# Patient Record
Sex: Female | Born: 1980
Health system: Southern US, Community
[De-identification: ages and names within clinical notes are randomized; demographics above are authoritative.]

## PROBLEM LIST (undated history)

## (undated) DIAGNOSIS — L709 Acne, unspecified: Secondary | ICD-10-CM

## (undated) DIAGNOSIS — F32A Depression, unspecified: Secondary | ICD-10-CM

## (undated) DIAGNOSIS — F329 Major depressive disorder, single episode, unspecified: Secondary | ICD-10-CM

## (undated) HISTORY — PX: BREAST ENHANCEMENT SURGERY: SHX7

---

## 2004-07-03 DIAGNOSIS — D229 Melanocytic nevi, unspecified: Secondary | ICD-10-CM

## 2004-07-03 HISTORY — DX: Melanocytic nevi, unspecified: D22.9

## 2008-03-08 ENCOUNTER — Emergency Department (HOSPITAL_COMMUNITY): Admission: EM | Admit: 2008-03-08 | Discharge: 2008-03-08 | Payer: Self-pay | Admitting: Emergency Medicine

## 2017-04-01 ENCOUNTER — Other Ambulatory Visit: Payer: Self-pay | Admitting: Physician Assistant

## 2017-04-01 ENCOUNTER — Other Ambulatory Visit (HOSPITAL_COMMUNITY)
Admission: RE | Admit: 2017-04-01 | Discharge: 2017-04-01 | Disposition: A | Discharge status: Home / Self Care | Payer: Managed Care, Other (non HMO) | Source: Ambulatory Visit | Attending: Physician Assistant | Admitting: Physician Assistant

## 2017-04-01 DIAGNOSIS — Z124 Encounter for screening for malignant neoplasm of cervix: Secondary | ICD-10-CM | POA: Insufficient documentation

## 2017-04-08 LAB — CYTOLOGY - PAP
Bacterial vaginitis: NEGATIVE
Candida vaginitis: NEGATIVE
DIAGNOSIS: NEGATIVE
HPV (WINDOPATH): NOT DETECTED
TRICH (WINDOWPATH): NEGATIVE

## 2017-11-18 MED FILL — valACYclovir HCL 1 GM TABS: 1 | 30 days supply | Qty: 60 | Fill #0

## 2017-12-03 DIAGNOSIS — F4323 Adjustment disorder with mixed anxiety and depressed mood: Secondary | ICD-10-CM | POA: Diagnosis not present

## 2017-12-16 DIAGNOSIS — F4323 Adjustment disorder with mixed anxiety and depressed mood: Secondary | ICD-10-CM | POA: Diagnosis not present

## 2017-12-17 DIAGNOSIS — F329 Major depressive disorder, single episode, unspecified: Secondary | ICD-10-CM | POA: Diagnosis not present

## 2017-12-23 DIAGNOSIS — F4323 Adjustment disorder with mixed anxiety and depressed mood: Secondary | ICD-10-CM | POA: Diagnosis not present

## 2017-12-29 DIAGNOSIS — F329 Major depressive disorder, single episode, unspecified: Secondary | ICD-10-CM | POA: Diagnosis not present

## 2018-01-11 DIAGNOSIS — L709 Acne, unspecified: Secondary | ICD-10-CM | POA: Diagnosis not present

## 2018-01-14 DIAGNOSIS — F4323 Adjustment disorder with mixed anxiety and depressed mood: Secondary | ICD-10-CM | POA: Diagnosis not present

## 2018-01-21 DIAGNOSIS — F4323 Adjustment disorder with mixed anxiety and depressed mood: Secondary | ICD-10-CM | POA: Diagnosis not present

## 2018-01-26 DIAGNOSIS — F329 Major depressive disorder, single episode, unspecified: Secondary | ICD-10-CM | POA: Diagnosis not present

## 2018-02-15 DIAGNOSIS — F329 Major depressive disorder, single episode, unspecified: Secondary | ICD-10-CM | POA: Diagnosis not present

## 2018-03-11 DIAGNOSIS — F4323 Adjustment disorder with mixed anxiety and depressed mood: Secondary | ICD-10-CM | POA: Diagnosis not present

## 2018-04-26 DIAGNOSIS — F4323 Adjustment disorder with mixed anxiety and depressed mood: Secondary | ICD-10-CM | POA: Diagnosis not present

## 2018-05-09 DIAGNOSIS — Z202 Contact with and (suspected) exposure to infections with a predominantly sexual mode of transmission: Secondary | ICD-10-CM | POA: Diagnosis not present

## 2018-05-09 DIAGNOSIS — R238 Other skin changes: Secondary | ICD-10-CM | POA: Diagnosis not present

## 2018-05-09 DIAGNOSIS — Z0001 Encounter for general adult medical examination with abnormal findings: Secondary | ICD-10-CM | POA: Diagnosis not present

## 2018-05-09 DIAGNOSIS — F329 Major depressive disorder, single episode, unspecified: Secondary | ICD-10-CM | POA: Diagnosis not present

## 2018-05-10 MED FILL — IMIQUIMOD 5 % CREA: 5 | 30 days supply | Qty: 24 | Fill #0 | Status: TO

## 2018-06-03 DIAGNOSIS — F4323 Adjustment disorder with mixed anxiety and depressed mood: Secondary | ICD-10-CM | POA: Diagnosis not present

## 2018-06-29 ENCOUNTER — Other Ambulatory Visit: Payer: Self-pay | Admitting: Physician Assistant

## 2018-06-29 DIAGNOSIS — D485 Neoplasm of uncertain behavior of skin: Secondary | ICD-10-CM | POA: Diagnosis not present

## 2018-07-29 ENCOUNTER — Emergency Department (INDEPENDENT_AMBULATORY_CARE_PROVIDER_SITE_OTHER): Payer: 59

## 2018-07-29 ENCOUNTER — Encounter: Payer: Self-pay | Admitting: *Deleted

## 2018-07-29 ENCOUNTER — Emergency Department (INDEPENDENT_AMBULATORY_CARE_PROVIDER_SITE_OTHER)
Admission: EM | Admit: 2018-07-29 | Discharge: 2018-07-29 | Disposition: A | Discharge status: Home / Self Care | Payer: 59 | Source: Home / Self Care | Attending: Family Medicine | Admitting: Family Medicine

## 2018-07-29 ENCOUNTER — Other Ambulatory Visit: Payer: Self-pay

## 2018-07-29 DIAGNOSIS — S52122A Displaced fracture of head of left radius, initial encounter for closed fracture: Secondary | ICD-10-CM

## 2018-07-29 DIAGNOSIS — S52125A Nondisplaced fracture of head of left radius, initial encounter for closed fracture: Secondary | ICD-10-CM

## 2018-07-29 DIAGNOSIS — M25422 Effusion, left elbow: Secondary | ICD-10-CM | POA: Diagnosis not present

## 2018-07-29 HISTORY — DX: Major depressive disorder, single episode, unspecified: F32.9

## 2018-07-29 HISTORY — DX: Depression, unspecified: F32.A

## 2018-07-29 HISTORY — DX: Acne, unspecified: L70.9

## 2018-07-29 NOTE — Discharge Instructions (Addendum)
Wear splint and sling. Apply ice pack for 20 to 30 minutes, 3 to 4 times daily  Continue until pain and swelling decrease.  May take Tylenol as needed for pain.

## 2018-07-29 NOTE — ED Triage Notes (Signed)
Pt c/o LT elbow pain post fall off of a hover board at 1700. She took IBF 800mg .

## 2018-07-29 NOTE — ED Provider Notes (Signed)
[facility]    CSN: [id number] Arrival date & time: 07/29/18  1918     History   Chief Complaint Chief Complaint  Patient presents with   Elbow Pain    HPI Tiffany Gonzalez is a 37 y.o. female.   Patient lost her balance while on her son's new hoverboard today.  She fell backwards, bracing herself with her left arm.  She subsequently had pain/swelling in her left elbow with mild pain in her proximal left forearm.  She denies other injury.   Arm Injury  Location:  Elbow and arm Arm location:  L forearm Elbow location:  L elbow Injury: yes   Time since incident:  3 hours Mechanism of injury comment:  Fall from a hoverboard Pain details:    Quality:  Aching   Radiates to:  L forearm   Severity:  Moderate   Onset quality:  Sudden   Duration:  3 hours   Timing:  Constant   Progression:  Unchanged Dislocation: no   Prior injury to area:  No Relieved by:  Nothing Worsened by:  Movement Ineffective treatments:  Ice Associated symptoms: decreased range of motion and swelling   Associated symptoms: no muscle weakness, no numbness, no stiffness and no tingling     Past Medical History:  Diagnosis Date   Acne    Depression     There are no active problems to display for this patient.   Past Surgical History:  Procedure Laterality Date   BREAST ENHANCEMENT SURGERY     CESAREAN SECTION      OB History   No obstetric history on file.      Home Medications    Prior to Admission medications   Medication Sig Start Date End Date Taking? Authorizing Provider  sertraline (ZOLOFT) 100 MG tablet Take 100 mg by mouth daily.   Yes [provider]  spironolactone (ALDACTONE) 100 MG tablet Take 100 mg by mouth daily.   Yes [provider]    Family History Family History  Problem Relation Age of Onset   Cancer Mother        breast   Heart failure Father     Social History Social History   Tobacco Use   Smoking status:  Never Smoker   Smokeless tobacco: Never Used  Substance Use Topics   Alcohol use: Yes   Drug use: Never     Allergies   Patient has no known allergies.   Review of Systems Review of Systems  Musculoskeletal: Negative for stiffness.  All other systems reviewed and are negative.    Physical Exam Triage Vital Signs ED Triage Vitals [07/29/18 1937]  Enc Vitals Group     BP 121/77     Pulse Rate 78     Resp 18     Temp 97.6 F (36.4 C)     Temp Source Oral     SpO2 97 %     Weight 141 lb (64 kg)     Height 5\' 5"  (1.651 m)     Head Circumference      Peak Flow      Pain Score 5     Pain Loc      Pain Edu?      Excl. in [place]?    No data found.  Updated Vital Signs BP 121/77 (BP Location: Right Arm)    Pulse 78    Temp 97.6 F (36.4 C) (Oral)    Resp 18  Ht 5\' 5"  (1.651 m)    Wt 64 kg    LMP 07/22/2018    SpO2 97%    BMI 23.46 kg/m   Visual Acuity Right Eye Distance:   Left Eye Distance:   Bilateral Distance:    Right Eye Near:   Left Eye Near:    Bilateral Near:     Physical Exam Vitals signs and nursing note reviewed.  Constitutional:      General: She is not in acute distress.    Appearance: Normal appearance. She is not ill-appearing or diaphoretic.  HENT:     Head: Normocephalic.     Right Ear: External ear normal.     Left Ear: External ear normal.     Nose: Nose normal.  Eyes:     Pupils: Pupils are equal, round, and reactive to light.  Neck:     Musculoskeletal: Normal range of motion.  Cardiovascular:     Rate and Rhythm: Normal rate.  Pulmonary:     Effort: Pulmonary effort is normal.  Musculoskeletal:     Left elbow: She exhibits decreased range of motion and swelling. Tenderness found. Radial head and olecranon process tenderness noted.       Arms:     Comments: Left elbow has diffuse swelling/tenderness over olecranon.  She has difficulty fully extending left elbow.  Tenderness extends to left proximal forearm.  There is tenderness  over the radial head.  Left shoulder and wrist have full range of motion.  Distal neurovascular function is intact.   Skin:    General: Skin is warm and dry.  Neurological:     Mental Status: She is alert.      UC Treatments / Results  Labs (all labs ordered are listed, but only abnormal results are displayed) Labs Reviewed - No data to display  EKG None  Radiology Dg Elbow Complete Left  Result Date: 07/29/2018 CLINICAL DATA:  Left elbow pain after falling off of a hoverboard today. Initial encounter. EXAM: LEFT ELBOW - COMPLETE 3+ VIEW COMPARISON:  None. FINDINGS: There is a large elbow joint effusion, and there is a radial head fracture demonstrating at most minimal displacement. There is no dislocation. IMPRESSION: Radial head fracture with large elbow joint effusion. Electronically Signed   By: Logan Bores M.D.   On: 07/29/2018 20:09   Dg Forearm Left  Result Date: 07/29/2018 CLINICAL DATA:  Left elbow pain after falling off of a hoverboard today. Initial encounter. EXAM: LEFT FOREARM - 2 VIEW COMPARISON:  None. FINDINGS: Elbow joint effusion and radial head fracture are more fully evaluated on separate elbow radiographs. There is no fracture of the more distal radius or ulna. The wrist is located. IMPRESSION: Elbow joint effusion and radial head fracture, more fully evaluated on separate elbow radiographs. No additional fracture of the forearm. Electronically Signed   By: Logan Bores M.D.   On: 07/29/2018 20:10    Procedures Procedures (including critical care time)  Medications Ordered in UC Medications - No data to display  Initial Impression / Assessment and Plan / UC Course  I have reviewed the triage vital signs and the nursing notes.  Pertinent labs & imaging results that were available during my care of the patient were reviewed by me and considered in my medical decision making (see chart for details).    Fabricated and applied sugartong splint.  Dispensed  sling immobilizer. Patient declines pain medication. Followup with sports medicine physician or orthopedist as soon as possible for fracture  management.  Final Clinical Impressions(s) / UC Diagnoses   Final diagnoses:  Closed nondisplaced fracture of head of left radius, initial encounter     Discharge Instructions     Wear splint and sling. Apply ice pack for 20 to 30 minutes, 3 to 4 times daily  Continue until pain and swelling decrease.  May take Tylenol as needed for pain.    ED Prescriptions    None         Kandra Nicolas, MD 07/29/18 2104

## 2018-08-02 DIAGNOSIS — S52045A Nondisplaced fracture of coronoid process of left ulna, initial encounter for closed fracture: Secondary | ICD-10-CM | POA: Diagnosis not present

## 2018-08-02 DIAGNOSIS — S52125A Nondisplaced fracture of head of left radius, initial encounter for closed fracture: Secondary | ICD-10-CM | POA: Diagnosis not present

## 2018-08-04 DIAGNOSIS — S52122D Displaced fracture of head of left radius, subsequent encounter for closed fracture with routine healing: Secondary | ICD-10-CM | POA: Diagnosis not present

## 2018-08-04 DIAGNOSIS — S52044D Nondisplaced fracture of coronoid process of right ulna, subsequent encounter for closed fracture with routine healing: Secondary | ICD-10-CM | POA: Diagnosis not present

## 2018-08-11 DIAGNOSIS — S52044D Nondisplaced fracture of coronoid process of right ulna, subsequent encounter for closed fracture with routine healing: Secondary | ICD-10-CM | POA: Diagnosis not present

## 2018-08-11 DIAGNOSIS — S52122D Displaced fracture of head of left radius, subsequent encounter for closed fracture with routine healing: Secondary | ICD-10-CM | POA: Diagnosis not present

## 2018-08-16 DIAGNOSIS — S52044D Nondisplaced fracture of coronoid process of right ulna, subsequent encounter for closed fracture with routine healing: Secondary | ICD-10-CM | POA: Diagnosis not present

## 2018-08-16 DIAGNOSIS — S52122D Displaced fracture of head of left radius, subsequent encounter for closed fracture with routine healing: Secondary | ICD-10-CM | POA: Diagnosis not present

## 2018-08-25 DIAGNOSIS — S52044D Nondisplaced fracture of coronoid process of right ulna, subsequent encounter for closed fracture with routine healing: Secondary | ICD-10-CM | POA: Diagnosis not present

## 2018-08-25 DIAGNOSIS — S52122D Displaced fracture of head of left radius, subsequent encounter for closed fracture with routine healing: Secondary | ICD-10-CM | POA: Diagnosis not present

## 2018-08-26 ENCOUNTER — Other Ambulatory Visit: Payer: Self-pay | Admitting: Orthopaedic Surgery

## 2018-08-26 DIAGNOSIS — S52122D Displaced fracture of head of left radius, subsequent encounter for closed fracture with routine healing: Secondary | ICD-10-CM

## 2018-08-30 DIAGNOSIS — S52122D Displaced fracture of head of left radius, subsequent encounter for closed fracture with routine healing: Secondary | ICD-10-CM | POA: Diagnosis not present

## 2018-08-30 DIAGNOSIS — S52044D Nondisplaced fracture of coronoid process of right ulna, subsequent encounter for closed fracture with routine healing: Secondary | ICD-10-CM | POA: Diagnosis not present

## 2018-08-31 ENCOUNTER — Ambulatory Visit: Payer: 59

## 2018-09-15 DIAGNOSIS — S52122D Displaced fracture of head of left radius, subsequent encounter for closed fracture with routine healing: Secondary | ICD-10-CM | POA: Diagnosis not present

## 2018-09-15 DIAGNOSIS — S52044D Nondisplaced fracture of coronoid process of right ulna, subsequent encounter for closed fracture with routine healing: Secondary | ICD-10-CM | POA: Diagnosis not present

## 2019-01-19 MED FILL — SPIRONOLACTONE 100 MG TAB: 100 | 30 days supply | Qty: 60 | Fill #0

## 2019-01-19 MED FILL — SERTRALINE HCL 100 MG TAB: 100 | 90 days supply | Qty: 90 | Fill #0

## 2019-04-26 MED FILL — SPIRONOLACTONE 100 MG TAB: 100 | 30 days supply | Qty: 60 | Fill #1

## 2019-04-26 MED FILL — SERTRALINE HCL 100 MG TAB: 100 | 90 days supply | Qty: 90 | Fill #0

## 2020-10-11 IMAGING — DX DG ELBOW COMPLETE 3+V*L*
4 series · 4 of 4 positions shown · non-contrast
Comparison: None.

CLINICAL DATA: Left elbow pain after falling off of a hoverboard
today. Initial encounter.

EXAM:
LEFT ELBOW - COMPLETE 3+ VIEW

[elbow ap]
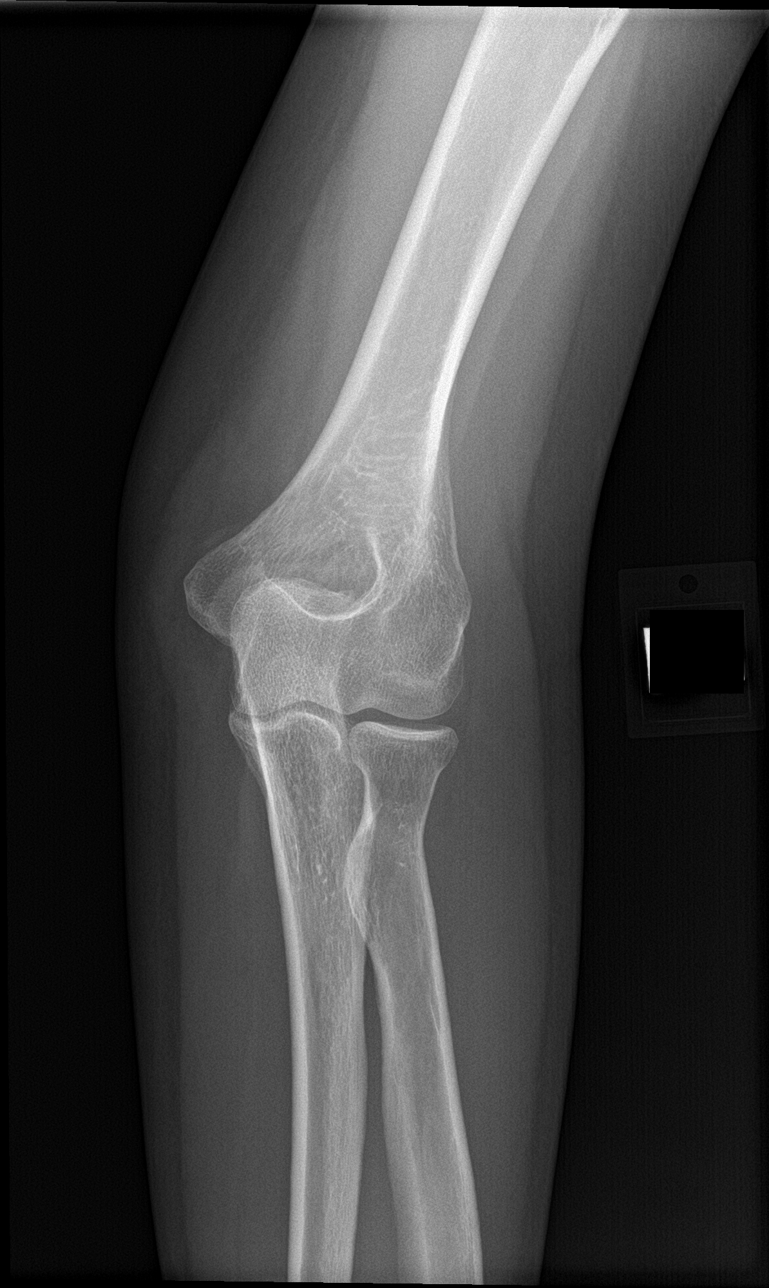

[elbow obl (1 of 2)]
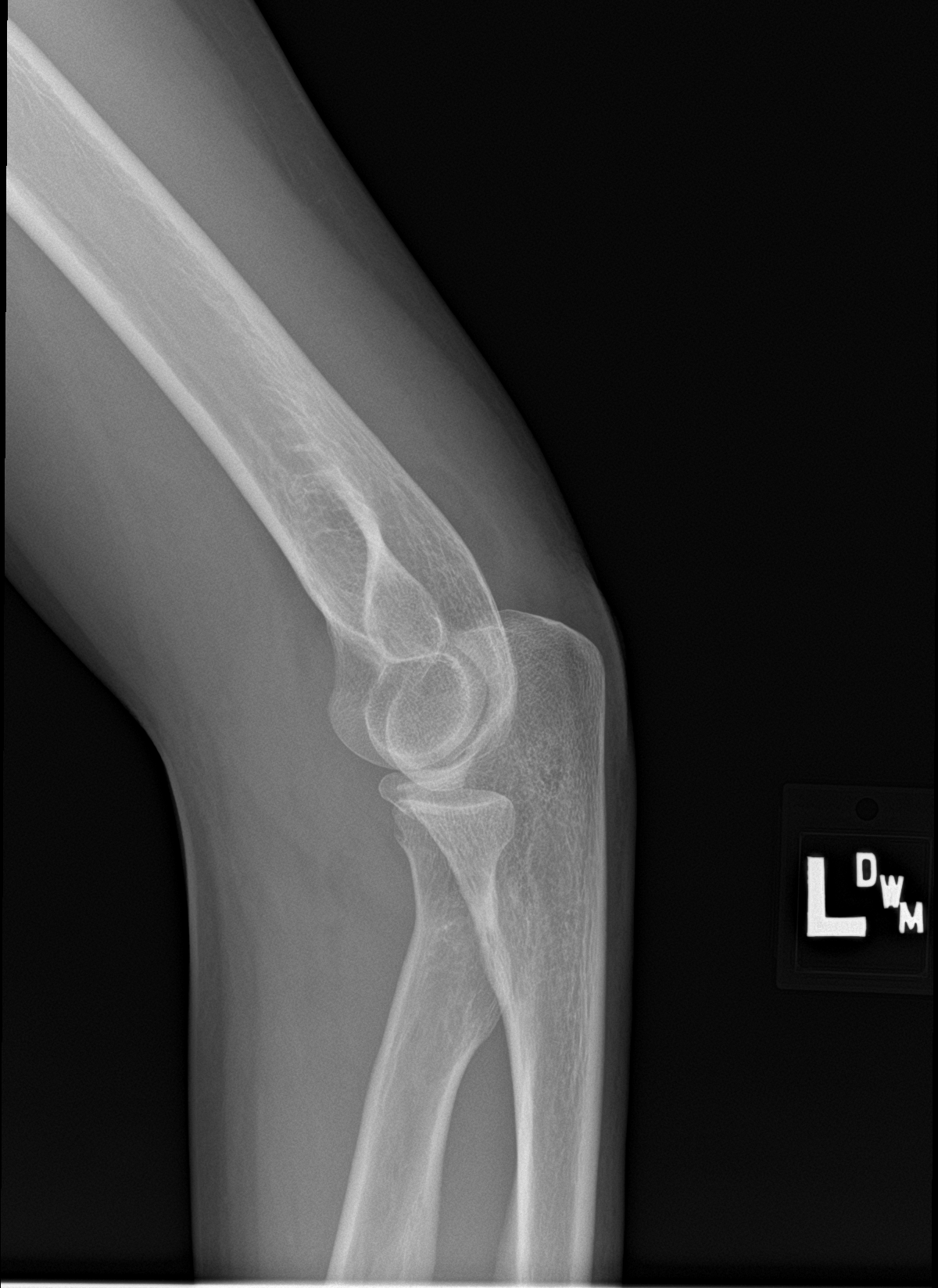

[elbow lat]
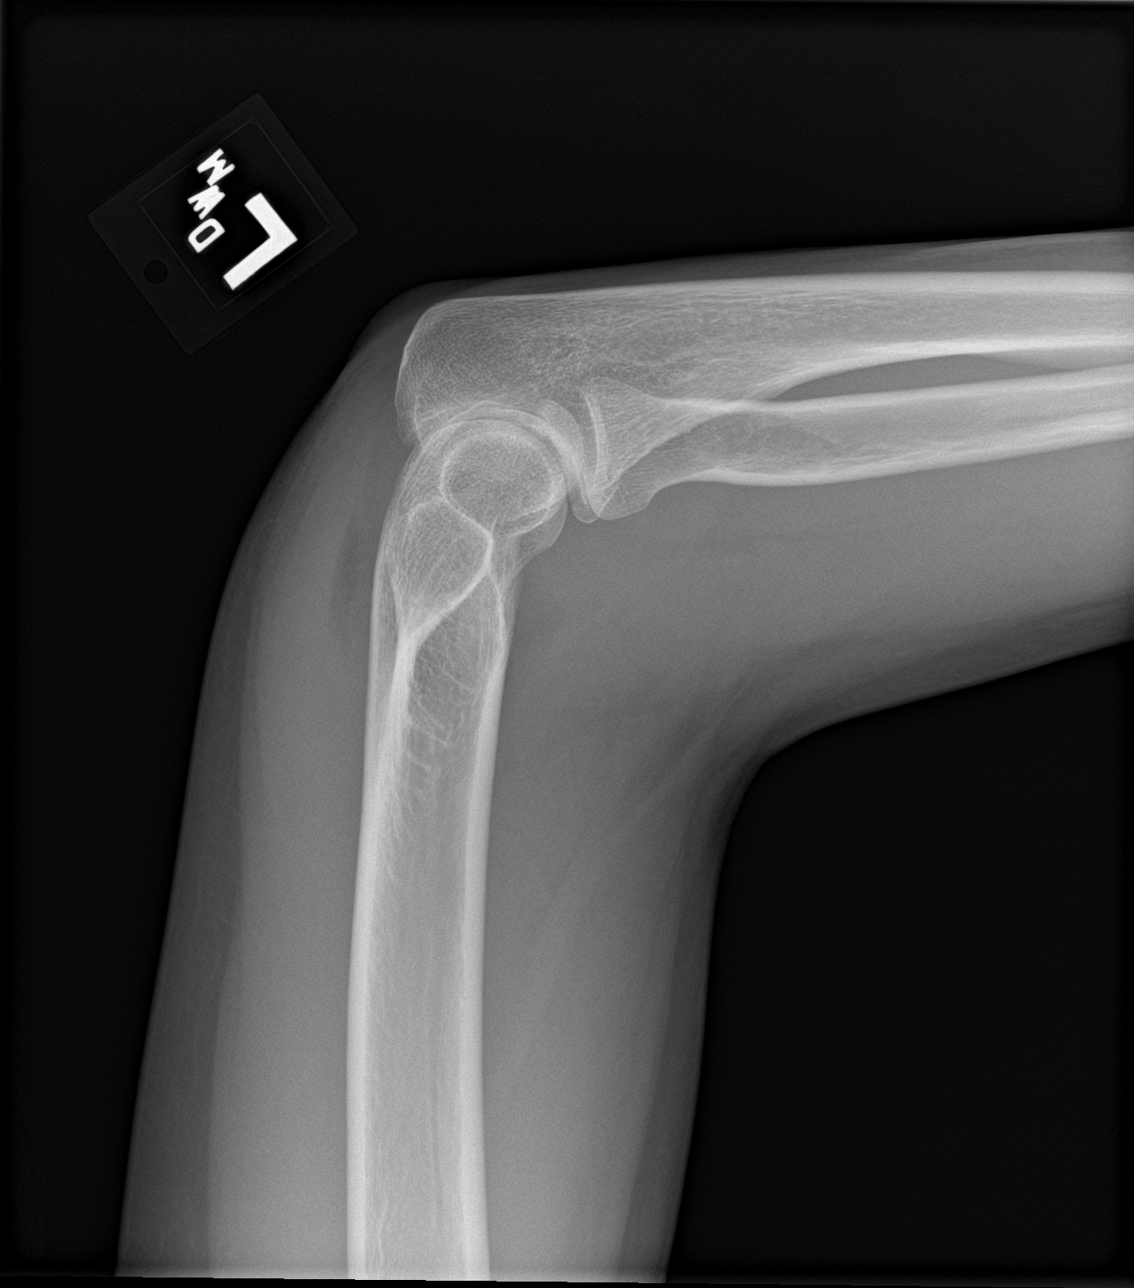

[elbow obl (2 of 2)]
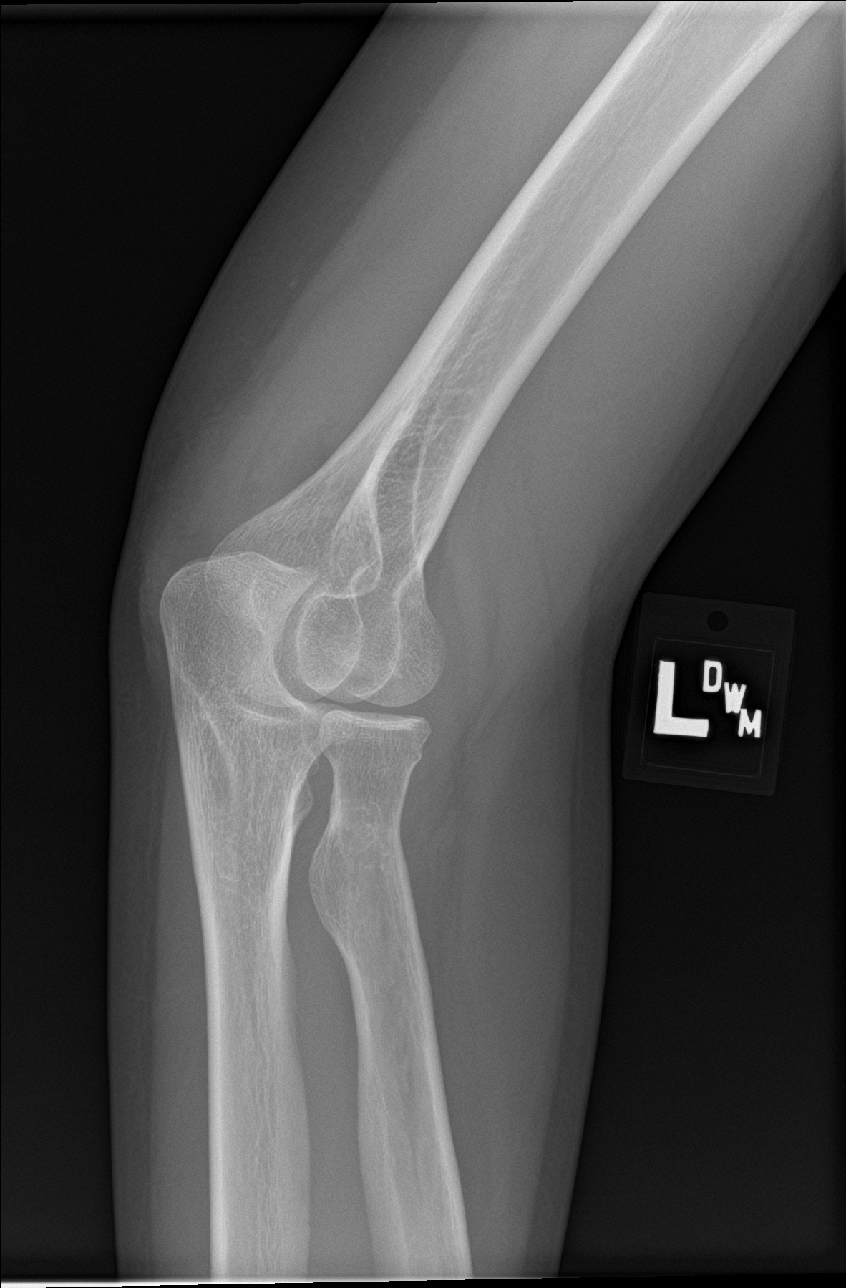

[4 of 4 positions shown; findings below may reference images not displayed]

FINDINGS: There is a large elbow joint effusion, and there is a radial head
fracture demonstrating at most minimal displacement. There is no
dislocation.
IMPRESSION: Radial head fracture with large elbow joint effusion.

## 2021-03-30 ENCOUNTER — Other Ambulatory Visit: Payer: Self-pay | Admitting: Physician Assistant

## 2021-04-02 ENCOUNTER — Other Ambulatory Visit: Payer: Self-pay | Admitting: Physician Assistant

## 2021-04-02 NOTE — Telephone Encounter (Signed)
Patient needs office visit.  

## 2021-04-08 ENCOUNTER — Other Ambulatory Visit: Payer: Self-pay

## 2021-04-08 ENCOUNTER — Ambulatory Visit (INDEPENDENT_AMBULATORY_CARE_PROVIDER_SITE_OTHER): Payer: 59 | Admitting: Dermatology

## 2021-04-08 ENCOUNTER — Encounter: Payer: Self-pay | Admitting: Dermatology

## 2021-04-08 DIAGNOSIS — L700 Acne vulgaris: Secondary | ICD-10-CM

## 2021-04-08 DIAGNOSIS — Z1283 Encounter for screening for malignant neoplasm of skin: Secondary | ICD-10-CM | POA: Diagnosis not present

## 2021-04-08 DIAGNOSIS — L719 Rosacea, unspecified: Secondary | ICD-10-CM | POA: Diagnosis not present

## 2021-04-08 MED ORDER — IVERMECTIN 1 % EX CREA: TOPICAL_CREAM | CUTANEOUS | 2 refills | Status: DC

## 2021-04-08 MED ORDER — SPIRONOLACTONE 50 MG PO TABS: 50.0000 mg | ORAL_TABLET | Freq: Two times a day (BID) | ORAL | 6 refills | Status: AC

## 2021-04-08 MED ORDER — IVERMECTIN 1 % EX CREA: TOPICAL_CREAM | CUTANEOUS | 2 refills | Status: AC

## 2021-04-12 ENCOUNTER — Encounter: Payer: Self-pay | Admitting: Dermatology

## 2021-04-12 NOTE — Progress Notes (Signed)
   Follow-Up Visit   Subjective  Tiffany Gonzalez is a 40 y.o. female who presents for the following: Annual Exam (Refill 100 qd spironolactone 100 mg qd).  Recheck face and check back. Location:  Duration:  Quality:  Associated Signs/Symptoms: Modifying Factors:  Severity:  Timing: Context:   Objective  Well appearing patient in no apparent distress; mood and affect are within normal limits. Sun exposed areas and back examined, no lesions suggestive of skin cancer.  Head Patient pleased with her response on spironolactone, denies any side effects.  We discussed possible switch to the new topical Winlevi but for now we will continue the oral.  Facial acne needs refill on spironolactone 100 mg qd  Head Her central facial redness fits mild rosacea, perhaps exacerbated by the COVID mask.  She is interested in treatment.    All sun exposed areas plus back examined.   Assessment & Plan    Acne vulgaris Head  Frontal lactone 100 mg daily.  Reminded of small chance of effect on blood pressure or potassium or breast symptoms.  spironolactone (ALDACTONE) 50 MG tablet - Head Take 1 tablet (50 mg total) by mouth 2 (two) times daily.  Rosacea Head  If out-of-pocket cost is not too high, she will try cilantro nightly for 6 weeks.  Follow-up by phone at that time.  Related Medications Ivermectin (SOOLANTRA) 1 % CREA Apply to face daily  Encounter for screening for malignant neoplasm of skin  Self examine twice annually, continued ultraviolet protection.      I, Lavonna Monarch, MD, have reviewed all documentation for this visit.  The documentation on 04/12/21 for the exam, diagnosis, procedures, and orders are all accurate and complete.

## 2021-06-10 ENCOUNTER — Ambulatory Visit (INDEPENDENT_AMBULATORY_CARE_PROVIDER_SITE_OTHER): Payer: Self-pay | Admitting: Dermatology

## 2021-06-10 ENCOUNTER — Other Ambulatory Visit: Payer: Self-pay

## 2021-06-10 DIAGNOSIS — L988 Other specified disorders of the skin and subcutaneous tissue: Secondary | ICD-10-CM

## 2021-06-10 NOTE — Progress Notes (Signed)
   Follow-Up Visit   Subjective  Tiffany Gonzalez is a 40 y.o. female who presents for the following: Facial Elastosis (Patient here today for botox. ) Also interested in filler for perioral/marionette line area and possibly NL folds.   The following portions of the chart were reviewed this encounter and updated as appropriate:   Tobacco  Allergies  Meds  Problems  Med Hx  Surg Hx  Fam Hx      Review of Systems:  No other skin or systemic complaints except as noted in HPI or Assessment and Plan.  Objective  Well appearing patient in no apparent distress; mood and affect are within normal limits.  A focused examination was performed including face. Relevant physical exam findings are noted in the Assessment and Plan.  face Rhytides and volume loss.                           Assessment & Plan  Elastosis of skin face  Filling material injection - face Prior to the procedure, the patient's past medical history, allergies and the rare but potential risks and complications were reviewed with the patient and a signed consent was obtained. Pre and post-treatment care was discussed and instructions provided.  Location: nasolabial folds and marionette lines  Filler Type: Restylane defyne  Procedure: The area was prepped thoroughly with Puracyn. After introducing the needle into the desired treatment area, the syringe plunger was drawn back to ensure there was no flash of blood prior to injecting the filler in order to minimize risk of intravascular injection and vascular occlusion. After injection of the filler, the treated areas were cleansed and iced to reduce swelling. Post-treatment instructions were reviewed with the patient.       Patient tolerated the procedure well. The patient will call with any problems, questions or concerns prior to their next appointment.   Filling material injection - face Location: See attached image  Informed consent:  Discussed risks (infection, pain, bleeding, bruising, swelling, allergic reaction, paralysis of nearby muscles, eyelid droop, double vision, neck weakness, difficulty breathing, headache, undesirable cosmetic result, and need for additional treatment) and benefits of the procedure, as well as the alternatives.  Informed consent was obtained.  Preparation: The area was cleansed with alcohol.  Procedure Details:  Botox was injected into the dermis with a 30-gauge needle. Pressure applied to any bleeding. Ice packs offered for swelling.  Lot Number:  [id number] Expiration:  03/2023  Total Units Injected:  46  Plan: Tylenol may be used for headache.  Allow 2 weeks before returning to clinic for additional dosing as needed. Patient will call for any problems.   Return if symptoms worsen or fail to improve.  Graciella Belton, RMA, am acting as scribe for Forest Gleason, MD .  Documentation: I have reviewed the above documentation for accuracy and completeness, and I agree with the above.  Forest Gleason, MD

## 2021-06-10 NOTE — Patient Instructions (Signed)

## 2021-06-12 ENCOUNTER — Encounter: Payer: Self-pay | Admitting: Dermatology

## 2021-07-21 ENCOUNTER — Ambulatory Visit: Payer: 59 | Admitting: Physician Assistant

## 2022-04-08 ENCOUNTER — Ambulatory Visit: Payer: 59 | Admitting: Physician Assistant
